# Patient Record
Sex: Female | Born: 1990 | Race: White | Hispanic: No | Marital: Single | Smoking: Current every day smoker
Health system: Southern US, Community
[De-identification: ages and names within clinical notes are randomized; demographics above are authoritative.]

## PROBLEM LIST (undated history)

## (undated) DIAGNOSIS — J45909 Unspecified asthma, uncomplicated: Secondary | ICD-10-CM

## (undated) DIAGNOSIS — F419 Anxiety disorder, unspecified: Secondary | ICD-10-CM

## (undated) DIAGNOSIS — J939 Pneumothorax, unspecified: Secondary | ICD-10-CM

## (undated) HISTORY — PX: TONSILLECTOMY: SUR1361

---

## 2014-05-09 ENCOUNTER — Inpatient Hospital Stay
Admit: 2014-05-09 | Discharge: 2014-05-09 | Disposition: A | Discharge status: Home / Self Care | Payer: Self-pay | Attending: Emergency Medicine

## 2014-05-09 ENCOUNTER — Emergency Department: Admit: 2014-05-09 | Payer: Self-pay

## 2014-05-09 DIAGNOSIS — J45901 Unspecified asthma with (acute) exacerbation: Secondary | ICD-10-CM

## 2014-05-09 DIAGNOSIS — J209 Acute bronchitis, unspecified: Secondary | ICD-10-CM

## 2014-05-09 MED ORDER — ALBUTEROL SULFATE HFA 90 MCG/ACTUATION AEROSOL INHALER
90 mcg/actuation | RESPIRATORY_TRACT | Status: AC | PRN
Start: 2014-05-09 — End: ?

## 2014-05-09 MED ORDER — IPRATROPIUM-ALBUTEROL 2.5 MG-0.5 MG/3 ML NEB SOLUTION
2.5 mg-0.5 mg/3 ml | RESPIRATORY_TRACT | Status: AC
Start: 2014-05-09 — End: 2014-05-09
  Administered 2014-05-09: 08:00:00 via RESPIRATORY_TRACT

## 2014-05-09 MED ORDER — AZITHROMYCIN 250 MG TAB
250 mg | ORAL | Status: AC
Start: 2014-05-09 — End: 2014-05-09
  Administered 2014-05-09: 18:00:00 via ORAL

## 2014-05-09 MED ORDER — IPRATROPIUM-ALBUTEROL 2.5 MG-0.5 MG/3 ML NEB SOLUTION
2.5 mg-0.5 mg/3 ml | RESPIRATORY_TRACT | Status: DC
Start: 2014-05-09 — End: 2014-05-09

## 2014-05-09 MED ORDER — AZITHROMYCIN 250 MG TAB
250 mg | ORAL | Status: AC
Start: 2014-05-09 — End: ?

## 2014-05-09 MED ORDER — PREDNISONE 20 MG TAB
20 mg | ORAL_TABLET | Freq: Every day | ORAL | Status: AC
Start: 2014-05-09 — End: 2014-05-14

## 2014-05-09 MED ORDER — IPRATROPIUM-ALBUTEROL 2.5 MG-0.5 MG/3 ML NEB SOLUTION
2.5 mg-0.5 mg/3 ml | RESPIRATORY_TRACT | Status: AC
Start: 2014-05-09 — End: 2014-05-09
  Administered 2014-05-09: 16:00:00 via RESPIRATORY_TRACT

## 2014-05-09 MED ORDER — METHYLPREDNISOLONE (PF) 125 MG/2 ML IJ SOLR
125 mg/2 mL | INTRAMUSCULAR | Status: AC
Start: 2014-05-09 — End: 2014-05-09
  Administered 2014-05-09: 16:00:00 via INTRAMUSCULAR

## 2014-05-09 MED FILL — IPRATROPIUM-ALBUTEROL 2.5 MG-0.5 MG/3 ML NEB SOLUTION: 2.5 mg-0.5 mg/3 ml | RESPIRATORY_TRACT | Qty: 3

## 2014-05-09 MED FILL — SOLU-MEDROL (PF) 125 MG/2 ML SOLUTION FOR INJECTION: 125 mg/2 mL | INTRAMUSCULAR | Qty: 2

## 2014-05-09 MED FILL — AZITHROMYCIN 250 MG TAB: 250 mg | ORAL | Qty: 2

## 2014-05-09 NOTE — ED Notes (Signed)
Patient here this morning for same complaint attempt prednisone and inhaler pta no relief.

## 2014-05-09 NOTE — ED Notes (Signed)
Await xray

## 2014-05-09 NOTE — ED Notes (Signed)
Patient states wheezing since she last took her prednisone at around 1900, patient with audible wheezes. sats 94-95%

## 2014-05-09 NOTE — ED Notes (Signed)
I have reviewed discharge instructions with the patient.  The patient verbalized understanding.

## 2014-05-09 NOTE — ED Notes (Signed)
Rt at bedside for treatment

## 2014-05-09 NOTE — ED Provider Notes (Signed)
Patient is a 23 y.o. female presenting with wheezing. The history is provided by the patient.   Wheezing   This is a recurrent problem. The current episode started 1 to 2 hours ago. The problem occurs constantly. The problem has not changed since onset.Pertinent negatives include no chest pain and no fever. It is unknown what precipitated the problem. She has tried ipratropium inhalers and oral steroids for the symptoms. The treatment provided no relief. She has had no prior hospitalizations. She has had no prior ICU admissions. Her past medical history is significant for asthma.        Past Medical History   Diagnosis Date   ??? Asthma         Past Surgical History   Procedure Laterality Date   ??? Hx tonsillectomy           History reviewed. No pertinent family history.     History     Social History   ??? Marital Status: SINGLE     Spouse Name: N/A     Number of Children: N/A   ??? Years of Education: N/A     Occupational History   ??? Not on file.     Social History Main Topics   ??? Smoking status: Current Some Day Smoker   ??? Smokeless tobacco: Not on file   ??? Alcohol Use: Yes   ??? Drug Use: No   ??? Sexual Activity: Not on file     Other Topics Concern   ??? Not on file     Social History Narrative                  ALLERGIES: Review of patient's allergies indicates no known allergies.      Review of Systems   Constitutional: Negative for fever.   Respiratory: Positive for wheezing.    Cardiovascular: Negative for chest pain.   All other systems reviewed and are negative.      Filed Vitals:    05/09/14 1128 05/09/14 1142   BP: 118/50    Pulse: 116    Temp: 97.6 ??F (36.4 ??C)    Resp: 18    Height: 5\' 1"  (1.549 m)    Weight: 54.885 kg (121 lb)    SpO2: 95% 95%            Physical Exam   Constitutional: She is oriented to person, place, and time. She appears well-developed and well-nourished. No distress.   HENT:   Head: Normocephalic and atraumatic.   Marked nasal congestion     Eyes: Conjunctivae and EOM are normal. Pupils are equal, round, and reactive to light.   Neck: Normal range of motion. Neck supple.   Cardiovascular: Normal rate and regular rhythm.    Pulmonary/Chest: Effort normal. No respiratory distress. She has wheezes.   Wheezes more to rt than left lungs fields    Abdominal: Soft. Bowel sounds are normal.   Musculoskeletal: Normal range of motion. She exhibits no edema.   Neurological: She is alert and oriented to person, place, and time.   Skin: Skin is warm.   Nursing note and vitals reviewed.       MDM  Number of Diagnoses or Management Options  Diagnosis management comments: Asthmatic bronchitis  Pt much better post duo neb x 2 and solumedrol  Chest x ray w/o infiltrate, some emphysema but no crepitus, no difficulty swallowing  Pt discussed with Dr. Marcene CorningBourdon, stressed to start z pac today also, continue at home meds  along with over the counter cold meds       Amount and/or Complexity of Data Reviewed  Tests in the radiology section of CPT??: ordered and reviewed  Review and summarize past medical records: yes  Discuss the patient with other providers: yes    Risk of Complications, Morbidity, and/or Mortality  Presenting problems: moderate  Diagnostic procedures: moderate  Management options: low    Patient Progress  Patient progress: improved      Procedures

## 2014-05-09 NOTE — ED Provider Notes (Signed)
HPI Comments: 23 year old white female with history of asthma presents with cough and wheezing for the past day.  She did take 30 mg of prednisone she had from a previous episode and has been using albuterol with minimal improvement.  She has had subjective fever but no measured temperature.  No vomiting.  no leg pain or edema    Patient is a 23 y.o. female presenting with wheezing. The history is provided by the patient.   Wheezing   Associated symptoms include cough. Pertinent negatives include no fever, no abdominal pain, no vomiting, no headaches and no rash.        Past Medical History   Diagnosis Date   ??? Asthma         Past Surgical History   Procedure Laterality Date   ??? Hx tonsillectomy           History reviewed. No pertinent family history.     History     Social History   ??? Marital Status: SINGLE     Spouse Name: N/A     Number of Children: N/A   ??? Years of Education: N/A     Occupational History   ??? Not on file.     Social History Main Topics   ??? Smoking status: Current Some Day Smoker   ??? Smokeless tobacco: Not on file   ??? Alcohol Use: Yes   ??? Drug Use: No   ??? Sexual Activity: Not on file     Other Topics Concern   ??? Not on file     Social History Narrative   ??? No narrative on file                  ALLERGIES: Review of patient's allergies indicates no known allergies.      Review of Systems   Constitutional: Negative for fever.   Respiratory: Positive for cough and wheezing.    Gastrointestinal: Negative for nausea, vomiting and abdominal pain.   Musculoskeletal: Negative for back pain.   Skin: Negative for rash.   Neurological: Negative for headaches.       Filed Vitals:    05/09/14 0345 05/09/14 0422   BP: 121/90    Pulse: 103    Temp: 97.6 ??F (36.4 ??C)    Resp: 20    Height: 5\' 1"  (1.549 m)    Weight: 54.432 kg (120 lb)    SpO2: 96% 94%            Physical Exam   Constitutional: She appears well-developed and well-nourished. No distress.   HENT:   Head: Normocephalic and atraumatic.    Eyes: Pupils are equal, round, and reactive to light.   Neck: Normal range of motion. Neck supple.   Cardiovascular: Normal rate and regular rhythm.    Pulmonary/Chest: Effort normal. She has wheezes.   Abdominal: Soft. There is no tenderness.   Musculoskeletal: Normal range of motion. She exhibits no edema.   Neurological: She is alert.   Skin: Skin is warm and dry.   Psychiatric: She has a normal mood and affect.   Nursing note and vitals reviewed.       MDM  Number of Diagnoses or Management Options  Diagnosis management comments: Symptoms improved with a DuoNeb.  At this time patient is nontoxic and appears safe for outpatient management.  Treated with Zithromax, prednisone and albuterol      Procedures

## 2014-05-09 NOTE — ED Notes (Signed)
Patient transported to Xray.

## 2014-05-09 NOTE — ED Notes (Signed)
I have reviewed discharge instructions with the patient.  The patient verbalized understanding. Patient is ambulatory from facility in no acute distress. Patient has discharge instructions in hand at time of departure.

## 2015-07-20 ENCOUNTER — Inpatient Hospital Stay (HOSPITAL_COMMUNITY)
Admission: EM | Admit: 2015-07-20 | Discharge: 2015-07-25 | DRG: 166 | Disposition: A | Discharge status: Home / Self Care | Payer: BLUE CROSS/BLUE SHIELD | Attending: Orthopedic Surgery | Admitting: Orthopedic Surgery

## 2015-07-20 DIAGNOSIS — Y92411 Interstate highway as the place of occurrence of the external cause: Secondary | ICD-10-CM

## 2015-07-20 DIAGNOSIS — S060XAA Concussion with loss of consciousness status unknown, initial encounter: Secondary | ICD-10-CM | POA: Diagnosis present

## 2015-07-20 DIAGNOSIS — Z880 Allergy status to penicillin: Secondary | ICD-10-CM

## 2015-07-20 DIAGNOSIS — S52501A Unspecified fracture of the lower end of right radius, initial encounter for closed fracture: Secondary | ICD-10-CM | POA: Diagnosis present

## 2015-07-20 DIAGNOSIS — S2220XA Unspecified fracture of sternum, initial encounter for closed fracture: Secondary | ICD-10-CM | POA: Diagnosis present

## 2015-07-20 DIAGNOSIS — S2242XA Multiple fractures of ribs, left side, initial encounter for closed fracture: Secondary | ICD-10-CM | POA: Diagnosis present

## 2015-07-20 DIAGNOSIS — D62 Acute posthemorrhagic anemia: Secondary | ICD-10-CM | POA: Diagnosis not present

## 2015-07-20 DIAGNOSIS — S32049A Unspecified fracture of fourth lumbar vertebra, initial encounter for closed fracture: Secondary | ICD-10-CM | POA: Diagnosis present

## 2015-07-20 DIAGNOSIS — J189 Pneumonia, unspecified organism: Secondary | ICD-10-CM

## 2015-07-20 DIAGNOSIS — F1290 Cannabis use, unspecified, uncomplicated: Secondary | ICD-10-CM | POA: Diagnosis present

## 2015-07-20 DIAGNOSIS — Z9689 Presence of other specified functional implants: Secondary | ICD-10-CM

## 2015-07-20 DIAGNOSIS — S2222XA Fracture of body of sternum, initial encounter for closed fracture: Secondary | ICD-10-CM | POA: Diagnosis present

## 2015-07-20 DIAGNOSIS — J939 Pneumothorax, unspecified: Secondary | ICD-10-CM

## 2015-07-20 DIAGNOSIS — S272XXA Traumatic hemopneumothorax, initial encounter: Secondary | ICD-10-CM | POA: Diagnosis not present

## 2015-07-20 DIAGNOSIS — S61312A Laceration without foreign body of right middle finger with damage to nail, initial encounter: Secondary | ICD-10-CM

## 2015-07-20 DIAGNOSIS — F419 Anxiety disorder, unspecified: Secondary | ICD-10-CM | POA: Diagnosis present

## 2015-07-20 DIAGNOSIS — S27321A Contusion of lung, unilateral, initial encounter: Secondary | ICD-10-CM | POA: Diagnosis present

## 2015-07-20 DIAGNOSIS — S68622A Partial traumatic transphalangeal amputation of right middle finger, initial encounter: Secondary | ICD-10-CM | POA: Diagnosis present

## 2015-07-20 DIAGNOSIS — S62101A Fracture of unspecified carpal bone, right wrist, initial encounter for closed fracture: Secondary | ICD-10-CM | POA: Diagnosis present

## 2015-07-20 DIAGNOSIS — S27329A Contusion of lung, unspecified, initial encounter: Secondary | ICD-10-CM

## 2015-07-20 DIAGNOSIS — S2232XA Fracture of one rib, left side, initial encounter for closed fracture: Secondary | ICD-10-CM

## 2015-07-20 DIAGNOSIS — S52611A Displaced fracture of right ulna styloid process, initial encounter for closed fracture: Secondary | ICD-10-CM | POA: Diagnosis present

## 2015-07-20 DIAGNOSIS — J45909 Unspecified asthma, uncomplicated: Secondary | ICD-10-CM | POA: Diagnosis present

## 2015-07-20 DIAGNOSIS — B9562 Methicillin resistant Staphylococcus aureus infection as the cause of diseases classified elsewhere: Secondary | ICD-10-CM | POA: Diagnosis present

## 2015-07-20 DIAGNOSIS — F17200 Nicotine dependence, unspecified, uncomplicated: Secondary | ICD-10-CM | POA: Diagnosis present

## 2015-07-20 DIAGNOSIS — S51011A Laceration without foreign body of right elbow, initial encounter: Secondary | ICD-10-CM | POA: Diagnosis present

## 2015-07-20 DIAGNOSIS — S060X9A Concussion with loss of consciousness of unspecified duration, initial encounter: Secondary | ICD-10-CM | POA: Diagnosis present

## 2015-07-20 HISTORY — DX: Pneumothorax, unspecified: J93.9

## 2015-07-20 HISTORY — DX: Unspecified asthma, uncomplicated: J45.909

## 2015-07-20 HISTORY — DX: Anxiety disorder, unspecified: F41.9
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
## 2015-07-21 ENCOUNTER — Encounter (HOSPITAL_COMMUNITY): Admission: EM | Disposition: A | Discharge status: Home / Self Care | Payer: Self-pay | Source: Home / Self Care

## 2015-07-21 ENCOUNTER — Emergency Department (HOSPITAL_COMMUNITY): Payer: BLUE CROSS/BLUE SHIELD

## 2015-07-21 ENCOUNTER — Inpatient Hospital Stay (HOSPITAL_COMMUNITY): Payer: BLUE CROSS/BLUE SHIELD | Admitting: Certified Registered Nurse Anesthetist

## 2015-07-21 ENCOUNTER — Encounter (HOSPITAL_COMMUNITY): Payer: Self-pay | Admitting: *Deleted

## 2015-07-21 DIAGNOSIS — D62 Acute posthemorrhagic anemia: Secondary | ICD-10-CM | POA: Diagnosis not present

## 2015-07-21 DIAGNOSIS — S2242XA Multiple fractures of ribs, left side, initial encounter for closed fracture: Secondary | ICD-10-CM | POA: Diagnosis present

## 2015-07-21 DIAGNOSIS — S51011A Laceration without foreign body of right elbow, initial encounter: Secondary | ICD-10-CM | POA: Diagnosis present

## 2015-07-21 DIAGNOSIS — B9562 Methicillin resistant Staphylococcus aureus infection as the cause of diseases classified elsewhere: Secondary | ICD-10-CM | POA: Diagnosis present

## 2015-07-21 DIAGNOSIS — S68622A Partial traumatic transphalangeal amputation of right middle finger, initial encounter: Secondary | ICD-10-CM | POA: Diagnosis present

## 2015-07-21 DIAGNOSIS — S52611A Displaced fracture of right ulna styloid process, initial encounter for closed fracture: Secondary | ICD-10-CM | POA: Diagnosis present

## 2015-07-21 DIAGNOSIS — S52501A Unspecified fracture of the lower end of right radius, initial encounter for closed fracture: Secondary | ICD-10-CM | POA: Diagnosis present

## 2015-07-21 DIAGNOSIS — J45909 Unspecified asthma, uncomplicated: Secondary | ICD-10-CM | POA: Diagnosis present

## 2015-07-21 DIAGNOSIS — S272XXA Traumatic hemopneumothorax, initial encounter: Secondary | ICD-10-CM | POA: Diagnosis present

## 2015-07-21 DIAGNOSIS — F419 Anxiety disorder, unspecified: Secondary | ICD-10-CM | POA: Diagnosis present

## 2015-07-21 DIAGNOSIS — J939 Pneumothorax, unspecified: Secondary | ICD-10-CM | POA: Diagnosis present

## 2015-07-21 DIAGNOSIS — F17200 Nicotine dependence, unspecified, uncomplicated: Secondary | ICD-10-CM | POA: Diagnosis present

## 2015-07-21 DIAGNOSIS — Y92411 Interstate highway as the place of occurrence of the external cause: Secondary | ICD-10-CM | POA: Diagnosis not present

## 2015-07-21 DIAGNOSIS — S27321A Contusion of lung, unilateral, initial encounter: Secondary | ICD-10-CM | POA: Diagnosis present

## 2015-07-21 DIAGNOSIS — S2222XA Fracture of body of sternum, initial encounter for closed fracture: Secondary | ICD-10-CM | POA: Diagnosis present

## 2015-07-21 DIAGNOSIS — Z880 Allergy status to penicillin: Secondary | ICD-10-CM | POA: Diagnosis not present

## 2015-07-21 DIAGNOSIS — F1290 Cannabis use, unspecified, uncomplicated: Secondary | ICD-10-CM | POA: Diagnosis present

## 2015-07-21 DIAGNOSIS — S32049A Unspecified fracture of fourth lumbar vertebra, initial encounter for closed fracture: Secondary | ICD-10-CM | POA: Diagnosis present

## 2015-07-21 DIAGNOSIS — J189 Pneumonia, unspecified organism: Secondary | ICD-10-CM | POA: Diagnosis not present

## 2015-07-21 DIAGNOSIS — S060X9A Concussion with loss of consciousness of unspecified duration, initial encounter: Secondary | ICD-10-CM | POA: Diagnosis present

## 2015-07-21 HISTORY — DX: Pneumothorax, unspecified: J93.9

## 2015-07-21 HISTORY — PX: I & D EXTREMITY: SHX5045

## 2015-07-21 HISTORY — PX: OTHER SURGICAL HISTORY: SHX169

## 2015-07-21 HISTORY — PX: ORIF RADIAL FRACTURE: SHX5113

## 2015-07-21 LAB — CBC WITH DIFFERENTIAL/PLATELET
BASOS PCT: 0 %
Basophils Absolute: 0 10*3/uL (ref 0.0–0.1)
EOS ABS: 0.2 10*3/uL (ref 0.0–0.7)
Eosinophils Relative: 1 %
HEMATOCRIT: 49 % — AB (ref 36.0–46.0)
HEMOGLOBIN: 15.8 g/dL — AB (ref 12.0–15.0)
LYMPHS ABS: 1 10*3/uL (ref 0.7–4.0)
Lymphocytes Relative: 5 %
MCH: 30.3 pg (ref 26.0–34.0)
MCHC: 32.2 g/dL (ref 30.0–36.0)
MCV: 94 fL (ref 78.0–100.0)
Monocytes Absolute: 1 10*3/uL (ref 0.1–1.0)
Monocytes Relative: 5 %
NEUTROS ABS: 16.6 10*3/uL — AB (ref 1.7–7.7)
NEUTROS PCT: 89 %
Platelets: 348 10*3/uL (ref 150–400)
RBC: 5.21 MIL/uL — AB (ref 3.87–5.11)
RDW: 12.6 % (ref 11.5–15.5)
WBC: 18.8 10*3/uL — AB (ref 4.0–10.5)

## 2015-07-21 LAB — I-STAT BETA HCG BLOOD, ED (MC, WL, AP ONLY)

## 2015-07-21 LAB — URINALYSIS, ROUTINE W REFLEX MICROSCOPIC
BILIRUBIN URINE: NEGATIVE
GLUCOSE, UA: NEGATIVE mg/dL
Hgb urine dipstick: NEGATIVE
KETONES UR: 40 mg/dL — AB
Leukocytes, UA: NEGATIVE
Nitrite: NEGATIVE
PH: 7 (ref 5.0–8.0)
Protein, ur: NEGATIVE mg/dL
Specific Gravity, Urine: 1.012 (ref 1.005–1.030)

## 2015-07-21 LAB — BASIC METABOLIC PANEL
Anion gap: 13 (ref 5–15)
BUN: 5 mg/dL — ABNORMAL LOW (ref 6–20)
CHLORIDE: 104 mmol/L (ref 101–111)
CO2: 22 mmol/L (ref 22–32)
Calcium: 10.1 mg/dL (ref 8.9–10.3)
Creatinine, Ser: 0.81 mg/dL (ref 0.44–1.00)
GFR calc non Af Amer: >60 mL/min (ref >60)
Glucose, Bld: 113 mg/dL — ABNORMAL HIGH (ref 65–99)
POTASSIUM: 4.6 mmol/L (ref 3.5–5.1)
SODIUM: 139 mmol/L (ref 135–145)

## 2015-07-21 LAB — SURGICAL PCR SCREEN
MRSA, PCR: POSITIVE — AB
Staphylococcus aureus: POSITIVE — AB

## 2015-07-21 LAB — ETHANOL

## 2015-07-21 SURGERY — OPEN REDUCTION INTERNAL FIXATION (ORIF) RADIAL FRACTURE
Anesthesia: General | Site: Wrist | Laterality: Right

## 2015-07-21 SURGERY — OPEN REDUCTION INTERNAL FIXATION (ORIF) RADIAL FRACTURE
Anesthesia: General | Laterality: Right

## 2015-07-21 MED ORDER — PROMETHAZINE HCL 25 MG/ML IJ SOLN: 6.2500 mg | INTRAMUSCULAR | Status: DC | PRN

## 2015-07-21 MED ORDER — HYDROMORPHONE HCL 1 MG/ML IJ SOLN
1.0000 mg | Freq: Once | INTRAMUSCULAR | Status: AC
  Administered 2015-07-21: 1 mg via INTRAVENOUS
  Filled 2015-07-21: qty 1

## 2015-07-21 MED ORDER — MIDAZOLAM HCL 2 MG/2ML IJ SOLN
4.0000 mg | Freq: Once | INTRAMUSCULAR | Status: AC
  Administered 2015-07-21: 4 mg via INTRAVENOUS

## 2015-07-21 MED ORDER — MEPIVACAINE HCL 1.5 % IJ SOLN
INTRAMUSCULAR | Status: DC | PRN
  Administered 2015-07-21: 10 mL via PERINEURAL

## 2015-07-21 MED ORDER — HYDROMORPHONE HCL 1 MG/ML IJ SOLN
1.0000 mg | Freq: Once | INTRAMUSCULAR | Status: AC
  Administered 2015-07-21: 1 mg via INTRAVENOUS

## 2015-07-21 MED ORDER — ONDANSETRON HCL 4 MG PO TABS: 4.0000 mg | ORAL_TABLET | Freq: Four times a day (QID) | ORAL | Status: DC | PRN

## 2015-07-21 MED ORDER — MIDAZOLAM HCL 2 MG/2ML IJ SOLN
INTRAMUSCULAR | Status: AC
  Filled 2015-07-21: qty 2

## 2015-07-21 MED ORDER — MUPIROCIN 2 % EX OINT
TOPICAL_OINTMENT | Freq: Two times a day (BID) | CUTANEOUS | Status: DC
  Administered 2015-07-21 – 2015-07-25 (×8): via NASAL

## 2015-07-21 MED ORDER — FENTANYL CITRATE (PF) 100 MCG/2ML IJ SOLN
INTRAMUSCULAR | Status: DC | PRN
  Administered 2015-07-21: 50 ug via INTRAVENOUS
  Administered 2015-07-21 (×2): 100 ug via INTRAVENOUS
  Administered 2015-07-21 (×2): 50 ug via INTRAVENOUS

## 2015-07-21 MED ORDER — POLYETHYLENE GLYCOL 3350 17 G PO PACK
17.0000 g | PACK | Freq: Every day | ORAL | Status: DC
  Filled 2015-07-21 (×3): qty 1

## 2015-07-21 MED ORDER — ONDANSETRON HCL 4 MG/2ML IJ SOLN: 4.0000 mg | Freq: Four times a day (QID) | INTRAMUSCULAR | Status: DC | PRN

## 2015-07-21 MED ORDER — ONDANSETRON HCL 4 MG/2ML IJ SOLN
INTRAMUSCULAR | Status: AC
  Filled 2015-07-21: qty 2

## 2015-07-21 MED ORDER — PHENYLEPHRINE HCL 10 MG/ML IJ SOLN
INTRAMUSCULAR | Status: DC | PRN
  Administered 2015-07-21: 80 ug via INTRAVENOUS
  Administered 2015-07-21: 40 ug via INTRAVENOUS
  Administered 2015-07-21 (×2): 80 ug via INTRAVENOUS
  Administered 2015-07-21: 40 ug via INTRAVENOUS

## 2015-07-21 MED ORDER — ENOXAPARIN SODIUM 30 MG/0.3ML ~~LOC~~ SOLN
30.0000 mg | SUBCUTANEOUS | Status: DC
  Administered 2015-07-22 – 2015-07-25 (×4): 30 mg via SUBCUTANEOUS
  Filled 2015-07-21 (×4): qty 0.3

## 2015-07-21 MED ORDER — FENTANYL CITRATE (PF) 100 MCG/2ML IJ SOLN
200.0000 ug | Freq: Once | INTRAMUSCULAR | Status: AC
  Administered 2015-07-21: 150 ug via INTRAVENOUS
  Filled 2015-07-21: qty 4

## 2015-07-21 MED ORDER — PROPOFOL 10 MG/ML IV BOLUS
INTRAVENOUS | Status: AC
  Filled 2015-07-21: qty 40

## 2015-07-21 MED ORDER — CEFAZOLIN SODIUM 1-5 GM-% IV SOLN
1.0000 g | Freq: Three times a day (TID) | INTRAVENOUS | Status: DC
  Administered 2015-07-21 – 2015-07-22 (×2): 1 g via INTRAVENOUS
  Filled 2015-07-21 (×6): qty 50

## 2015-07-21 MED ORDER — DOCUSATE SODIUM 100 MG PO CAPS
100.0000 mg | ORAL_CAPSULE | Freq: Two times a day (BID) | ORAL | Status: DC
  Administered 2015-07-22 (×2): 100 mg via ORAL
  Filled 2015-07-21 (×6): qty 1

## 2015-07-21 MED ORDER — METHOCARBAMOL 500 MG PO TABS
500.0000 mg | ORAL_TABLET | Freq: Four times a day (QID) | ORAL | Status: DC | PRN
  Administered 2015-07-22 – 2015-07-25 (×12): 500 mg via ORAL
  Filled 2015-07-21 (×12): qty 1

## 2015-07-21 MED ORDER — BUPIVACAINE-EPINEPHRINE (PF) 0.5% -1:200000 IJ SOLN
INTRAMUSCULAR | Status: DC | PRN
  Administered 2015-07-21: 20 mL via PERINEURAL

## 2015-07-21 MED ORDER — LIDOCAINE-EPINEPHRINE 1 %-1:100000 IJ SOLN
10.0000 mL | Freq: Once | INTRAMUSCULAR | Status: AC
  Administered 2015-07-21: 10 mL via INTRADERMAL
  Filled 2015-07-21: qty 1

## 2015-07-21 MED ORDER — LIDOCAINE HCL (CARDIAC) 20 MG/ML IV SOLN
INTRAVENOUS | Status: DC | PRN
  Administered 2015-07-21: 50 mg via INTRAVENOUS

## 2015-07-21 MED ORDER — MOMETASONE FURO-FORMOTEROL FUM 100-5 MCG/ACT IN AERO
2.0000 | INHALATION_SPRAY | Freq: Two times a day (BID) | RESPIRATORY_TRACT | Status: DC
  Administered 2015-07-21 – 2015-07-25 (×8): 2 via RESPIRATORY_TRACT
  Filled 2015-07-21: qty 8.8

## 2015-07-21 MED ORDER — OXYCODONE HCL 5 MG PO TABS: 5.0000 mg | ORAL_TABLET | ORAL | Status: DC | PRN

## 2015-07-21 MED ORDER — MIDAZOLAM HCL 2 MG/2ML IJ SOLN
INTRAMUSCULAR | Status: AC
  Filled 2015-07-21: qty 4

## 2015-07-21 MED ORDER — TETANUS-DIPHTH-ACELL PERTUSSIS 5-2.5-18.5 LF-MCG/0.5 IM SUSP
0.5000 mL | Freq: Once | INTRAMUSCULAR | Status: AC
  Administered 2015-07-21: 0.5 mL via INTRAMUSCULAR
  Filled 2015-07-21: qty 0.5

## 2015-07-21 MED ORDER — MIDAZOLAM HCL 5 MG/5ML IJ SOLN
INTRAMUSCULAR | Status: DC | PRN
  Administered 2015-07-21: 1 mg via INTRAVENOUS
  Administered 2015-07-21: 2 mg via INTRAVENOUS
  Administered 2015-07-21: 1 mg via INTRAVENOUS

## 2015-07-21 MED ORDER — IOHEXOL 300 MG/ML  SOLN
100.0000 mL | Freq: Once | INTRAMUSCULAR | Status: AC | PRN
  Administered 2015-07-21: 100 mL via INTRAVENOUS

## 2015-07-21 MED ORDER — LACTATED RINGERS IV SOLN
INTRAVENOUS | Status: DC | PRN
  Administered 2015-07-21 (×3): via INTRAVENOUS

## 2015-07-21 MED ORDER — ALBUTEROL SULFATE (2.5 MG/3ML) 0.083% IN NEBU
2.5000 mg | INHALATION_SOLUTION | Freq: Four times a day (QID) | RESPIRATORY_TRACT | Status: DC | PRN
  Administered 2015-07-21 – 2015-07-24 (×5): 2.5 mg via RESPIRATORY_TRACT
  Filled 2015-07-21 (×5): qty 3

## 2015-07-21 MED ORDER — SODIUM CHLORIDE 0.9 % IV BOLUS (SEPSIS)
1000.0000 mL | Freq: Once | INTRAVENOUS | Status: AC
  Administered 2015-07-21: 1000 mL via INTRAVENOUS

## 2015-07-21 MED ORDER — PROMETHAZINE HCL 25 MG RE SUPP: 12.5000 mg | Freq: Four times a day (QID) | RECTAL | Status: DC | PRN

## 2015-07-21 MED ORDER — CEFAZOLIN SODIUM-DEXTROSE 2-3 GM-% IV SOLR
2.0000 g | Freq: Once | INTRAVENOUS | Status: AC
  Administered 2015-07-21: 2 g via INTRAVENOUS
  Filled 2015-07-21: qty 50

## 2015-07-21 MED ORDER — VANCOMYCIN HCL IN DEXTROSE 750-5 MG/150ML-% IV SOLN
750.0000 mg | Freq: Two times a day (BID) | INTRAVENOUS | Status: DC
  Administered 2015-07-22 – 2015-07-25 (×7): 750 mg via INTRAVENOUS
  Filled 2015-07-21 (×8): qty 150

## 2015-07-21 MED ORDER — FENTANYL CITRATE (PF) 100 MCG/2ML IJ SOLN: 25.0000 ug | INTRAMUSCULAR | Status: DC | PRN

## 2015-07-21 MED ORDER — LIDOCAINE HCL (PF) 1 % IJ SOLN
INTRAMUSCULAR | Status: AC
  Administered 2015-07-21: 10 mL
  Filled 2015-07-21: qty 10

## 2015-07-21 MED ORDER — HYDROCODONE-ACETAMINOPHEN 10-325 MG PO TABS
0.5000 | ORAL_TABLET | ORAL | Status: DC | PRN
  Administered 2015-07-21 – 2015-07-23 (×7): 2 via ORAL
  Filled 2015-07-21 (×8): qty 2

## 2015-07-21 MED ORDER — HYDROMORPHONE HCL 1 MG/ML IJ SOLN
INTRAMUSCULAR | Status: AC
  Administered 2015-07-21: 1 mg via INTRAVENOUS
  Filled 2015-07-21: qty 1

## 2015-07-21 MED ORDER — HYDROMORPHONE HCL 1 MG/ML IJ SOLN
INTRAMUSCULAR | Status: AC
  Filled 2015-07-21: qty 1

## 2015-07-21 MED ORDER — HYDROMORPHONE HCL 1 MG/ML IJ SOLN
0.5000 mg | INTRAMUSCULAR | Status: DC | PRN
  Administered 2015-07-21 – 2015-07-23 (×9): 1 mg via INTRAVENOUS
  Filled 2015-07-21 (×9): qty 1

## 2015-07-21 MED ORDER — PROPOFOL 10 MG/ML IV BOLUS
INTRAVENOUS | Status: DC | PRN
  Administered 2015-07-21: 120 mg via INTRAVENOUS

## 2015-07-21 MED ORDER — METHOCARBAMOL 1000 MG/10ML IJ SOLN: 500.0000 mg | Freq: Four times a day (QID) | INTRAMUSCULAR | Status: DC | PRN

## 2015-07-21 MED ORDER — VITAMIN C 500 MG PO TABS
1000.0000 mg | ORAL_TABLET | Freq: Every day | ORAL | Status: DC
  Administered 2015-07-22 – 2015-07-25 (×4): 1000 mg via ORAL
  Filled 2015-07-21 (×4): qty 2

## 2015-07-21 MED ORDER — LIDOCAINE HCL (PF) 1 % IJ SOLN
INTRAMUSCULAR | Status: AC
  Filled 2015-07-21: qty 10

## 2015-07-21 MED ORDER — POTASSIUM CHLORIDE IN NACL 20-0.45 MEQ/L-% IV SOLN
INTRAVENOUS | Status: DC
  Administered 2015-07-21 – 2015-07-22 (×2): via INTRAVENOUS
  Filled 2015-07-21 (×3): qty 1000

## 2015-07-21 MED ORDER — VANCOMYCIN HCL 1000 MG IV SOLR
1000.0000 mg | INTRAVENOUS | Status: DC | PRN
  Administered 2015-07-21: 1000 mg via INTRAVENOUS

## 2015-07-21 MED ORDER — LIDOCAINE HCL (CARDIAC) 20 MG/ML IV SOLN
INTRAVENOUS | Status: AC
  Filled 2015-07-21: qty 5

## 2015-07-21 MED ORDER — LORAZEPAM 1 MG PO TABS
1.0000 mg | ORAL_TABLET | Freq: Three times a day (TID) | ORAL | Status: DC | PRN
  Administered 2015-07-21 – 2015-07-25 (×12): 1 mg via ORAL
  Filled 2015-07-21 (×12): qty 1

## 2015-07-21 MED ORDER — HYDROMORPHONE HCL 1 MG/ML IJ SOLN
0.5000 mg | INTRAMUSCULAR | Status: DC | PRN
  Administered 2015-07-21 (×7): 0.5 mg via INTRAVENOUS
  Filled 2015-07-21 (×3): qty 1

## 2015-07-21 MED ORDER — FENTANYL CITRATE (PF) 250 MCG/5ML IJ SOLN
INTRAMUSCULAR | Status: AC
  Filled 2015-07-21: qty 5

## 2015-07-21 MED ORDER — ONDANSETRON HCL 4 MG/2ML IJ SOLN
4.0000 mg | Freq: Four times a day (QID) | INTRAMUSCULAR | Status: DC | PRN
  Administered 2015-07-21: 4 mg via INTRAVENOUS

## 2015-07-21 MED ORDER — DOCUSATE SODIUM 100 MG PO CAPS: 100.0000 mg | ORAL_CAPSULE | Freq: Two times a day (BID) | ORAL | Status: DC

## 2015-07-21 MED ORDER — 0.9 % SODIUM CHLORIDE (POUR BTL) OPTIME
TOPICAL | Status: DC | PRN
  Administered 2015-07-21: 1000 mL

## 2015-07-21 MED ORDER — HYDROMORPHONE HCL 1 MG/ML IJ SOLN
1.0000 mg | Freq: Once | INTRAMUSCULAR | Status: AC
Start: 2015-07-21 — End: 2015-07-21
  Administered 2015-07-21: 1 mg via INTRAVENOUS
  Filled 2015-07-21: qty 1

## 2015-07-21 MED ORDER — LACTATED RINGERS IV SOLN
INTRAVENOUS | Status: DC
  Administered 2015-07-21: 16:00:00 via INTRAVENOUS

## 2015-07-21 MED ORDER — MUPIROCIN 2 % EX OINT
TOPICAL_OINTMENT | CUTANEOUS | Status: AC
  Filled 2015-07-21: qty 22

## 2015-07-21 MED ORDER — HYDROMORPHONE HCL 1 MG/ML IJ SOLN
0.2500 mg | INTRAMUSCULAR | Status: DC | PRN
  Administered 2015-07-21 (×2): 0.25 mg via INTRAVENOUS

## 2015-07-21 SURGICAL SUPPLY — 72 items
BAG ZIPLOCK 12X15 (MISCELLANEOUS) ×3 IMPLANT
BANDAGE ACE 3X5.8 VEL STRL LF (GAUZE/BANDAGES/DRESSINGS) ×3 IMPLANT
BANDAGE ACE 4X5 VEL STRL LF (GAUZE/BANDAGES/DRESSINGS) ×3 IMPLANT
BIT DRILL 2.2 SS TIBIAL (BIT) ×3 IMPLANT
BNDG COHESIVE 4X5 TAN STRL (GAUZE/BANDAGES/DRESSINGS) ×3 IMPLANT
BNDG GAUZE ELAST 4 BULKY (GAUZE/BANDAGES/DRESSINGS) ×6 IMPLANT
CUFF TOURN SGL QUICK 18 (TOURNIQUET CUFF) ×3 IMPLANT
DECANTER SPIKE VIAL GLASS SM (MISCELLANEOUS) ×3 IMPLANT
DRAIN PENROSE 18X1/4 LTX STRL (WOUND CARE) IMPLANT
DRAPE OEC MINIVIEW 54X84 (DRAPES) ×3 IMPLANT
DRAPE U-SHAPE 47X51 STRL (DRAPES) ×3 IMPLANT
DRSG ADAPTIC 3X8 NADH LF (GAUZE/BANDAGES/DRESSINGS) ×6 IMPLANT
DRSG PAD ABDOMINAL 8X10 ST (GAUZE/BANDAGES/DRESSINGS) ×3 IMPLANT
ELECT REM PT RETURN 9FT ADLT (ELECTROSURGICAL) ×3
ELECTRODE REM PT RTRN 9FT ADLT (ELECTROSURGICAL) ×2 IMPLANT
EVACUATOR 1/8 PVC DRAIN (DRAIN) ×3 IMPLANT
GAUZE SPONGE 4X4 12PLY STRL (GAUZE/BANDAGES/DRESSINGS) ×6 IMPLANT
GAUZE SPONGE 4X4 16PLY XRAY LF (GAUZE/BANDAGES/DRESSINGS) ×6 IMPLANT
GAUZE XEROFORM 5X9 LF (GAUZE/BANDAGES/DRESSINGS) ×3 IMPLANT
GLOVE BIO SURGEON STRL SZ8 (GLOVE) ×3 IMPLANT
GOWN STRL REUS W/TWL XL LVL3 (GOWN DISPOSABLE) ×3 IMPLANT
KIT BASIN OR (CUSTOM PROCEDURE TRAY) ×3 IMPLANT
KWIRE 4.0 X .045IN (WIRE) ×6 IMPLANT
KWIRE 4.0 X .062IN (WIRE) ×6 IMPLANT
LOOP VESSEL MAXI BLUE (MISCELLANEOUS) ×3 IMPLANT
MANIFOLD NEPTUNE II (INSTRUMENTS) ×3 IMPLANT
NS IRRIG 1000ML POUR BTL (IV SOLUTION) ×3 IMPLANT
PACK ORTHO EXTREMITY (CUSTOM PROCEDURE TRAY) ×3 IMPLANT
PAD CAST 3X4 CTTN HI CHSV (CAST SUPPLIES) ×2 IMPLANT
PAD CAST 4YDX4 CTTN HI CHSV (CAST SUPPLIES) ×2 IMPLANT
PADDING CAST COTTON 3X4 STRL (CAST SUPPLIES) ×1
PADDING CAST COTTON 4X4 STRL (CAST SUPPLIES) ×1
PEG LOCKING SMOOTH 2.2X16 (Screw) ×3 IMPLANT
PEG LOCKING SMOOTH 2.2X18 (Peg) ×12 IMPLANT
PLATE NARROW DVR RIGHT (Plate) ×3 IMPLANT
POSITIONER SURGICAL ARM (MISCELLANEOUS) ×3 IMPLANT
SCREW LOCK 14X2.7X 3 LD TPR (Screw) ×2 IMPLANT
SCREW LOCKING 2.7X13MM (Screw) ×9 IMPLANT
SCREW LOCKING 2.7X14 (Screw) ×1 IMPLANT
SCREW LOCKING 2.7X15MM (Screw) ×3 IMPLANT
SOL PREP POV-IOD 4OZ 10% (MISCELLANEOUS) ×3 IMPLANT
SOL PREP PROV IODINE SCRUB 4OZ (MISCELLANEOUS) ×3 IMPLANT
SPLINT FIBERGLASS 4X15 (CAST SUPPLIES) ×3 IMPLANT
SPLINT FINGER 4.25 BULB 911906 (SOFTGOODS) ×3 IMPLANT
SUT BONE WAX W31G (SUTURE) ×3 IMPLANT
SUT CHROMIC 5 0 P 3 (SUTURE) ×3 IMPLANT
SUT ETHILON 6 0 PS 3 18 (SUTURE) ×3 IMPLANT
SUT MERSILENE 4 0 P 3 (SUTURE) ×3 IMPLANT
SUT MNCRL AB 4-0 PS2 18 (SUTURE) ×3 IMPLANT
SUT PROLENE 3 0 PS 2 (SUTURE) ×3 IMPLANT
SUT PROLENE 4 0 P 3 18 (SUTURE) ×3 IMPLANT
SUT PROLENE 4 0 PS 2 18 (SUTURE) ×9 IMPLANT
SUT PROLENE 4 0 RB 1 (SUTURE) ×1
SUT PROLENE 4-0 RB1 .5 CRCL 36 (SUTURE) ×2 IMPLANT
SUT PROLENE 5 0 P 3 (SUTURE) ×6 IMPLANT
SUT VIC AB 0 CT1 27 (SUTURE) ×2
SUT VIC AB 0 CT1 27XBRD ANTBC (SUTURE) ×4 IMPLANT
SUT VIC AB 1 CT1 27 (SUTURE)
SUT VIC AB 1 CT1 27XBRD ANTBC (SUTURE) IMPLANT
SUT VIC AB 2-0 CT1 27 (SUTURE) ×1
SUT VIC AB 2-0 CT1 27XBRD (SUTURE) IMPLANT
SUT VIC AB 2-0 CT1 TAPERPNT 27 (SUTURE) ×2 IMPLANT
SUT VIC AB 3-0 SH 27 (SUTURE) ×1
SUT VIC AB 3-0 SH 27X BRD (SUTURE) ×2 IMPLANT
SWAB COLLECTION DEVICE MRSA (MISCELLANEOUS) IMPLANT
SWAB CULTURE ESWAB REG 1ML (MISCELLANEOUS) IMPLANT
SYR 20CC LL (SYRINGE) ×3 IMPLANT
SYR CONTROL 10ML LL (SYRINGE) IMPLANT
SYSTEM CHEST DRAIN TLS 7FR (DRAIN) ×3 IMPLANT
TOWEL OR 17X26 10 PK STRL BLUE (TOWEL DISPOSABLE) ×3 IMPLANT
UNDERPAD 30X30 INCONTINENT (UNDERPADS AND DIAPERS) ×3 IMPLANT
WATER STERILE IRR 1500ML POUR (IV SOLUTION) ×3 IMPLANT

## 2015-07-21 NOTE — Anesthesia Preprocedure Evaluation (Addendum)
Anesthesia Evaluation  Patient identified by MRN, date of birth, ID band Patient awake    Reviewed: Allergy & Precautions, NPO status , Patient's Chart, lab work & pertinent test results  History of Anesthesia Complications Negative for: history of anesthetic complications  Airway Mallampati: III  TM Distance: >3 FB Neck ROM: Full    Dental  (+) Teeth Intact, Dental Advisory Given, Chipped   Pulmonary asthma , Current Smoker,  Left chest tube  Inspiratory and expiratory wheezing bilaterally   + decreased breath sounds+ wheezing      Cardiovascular Exercise Tolerance: Good (-) hypertensionnegative cardio ROS Normal cardiovascular exam Rhythm:Regular Rate:Normal     Neuro/Psych PSYCHIATRIC DISORDERS Anxiety negative neurological ROS     GI/Hepatic negative GI ROS, Neg liver ROS,   Endo/Other  negative endocrine ROS  Renal/GU negative Renal ROS     Musculoskeletal negative musculoskeletal ROS (+)   Abdominal   Peds  Hematology negative hematology ROS (+)   Anesthesia Other Findings Day of surgery medications reviewed with the patient.  Reproductive/Obstetrics negative OB ROS                             Anesthesia Physical Anesthesia Plan  ASA: II  Anesthesia Plan: General   Post-op Pain Management:    Induction: Intravenous  Airway Management Planned: LMA  Additional Equipment:   Intra-op Plan:   Post-operative Plan: Extubation in OR  Informed Consent: I have reviewed the patients History and Physical, chart, labs and discussed the procedure including the risks, benefits and alternatives for the proposed anesthesia with the patient or authorized representative who has indicated his/her understanding and acceptance.   Dental advisory given  Plan Discussed with: CRNA  Anesthesia Plan Comments: (Risks/benefits of general anesthesia discussed with patient including risk of  damage to teeth, lips, gum, and tongue, nausea/vomiting, allergic reactions to medications, and the possibility of heart attack, stroke and death.  All patient questions answered.  Patient wishes to proceed.)        Anesthesia Quick Evaluation
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
## 2015-07-21 NOTE — Op Note (Signed)
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
## 2015-07-21 NOTE — Anesthesia Procedure Notes (Addendum)
Anesthesia Regional Block:  Interscalene brachial plexus block  Pre-Anesthetic Checklist: ,, timeout performed, Correct Patient, Correct Site, Correct Laterality, Correct Procedure, Correct Position, site marked, Risks and benefits discussed,  Surgical consent,  Pre-op evaluation,  At surgeon's request and post-op pain management  Laterality: Right  Prep: chloraprep       Needles:  Injection technique: Single-shot  Needle Type: Echogenic Stimulator Needle     Needle Length: 9cm 9 cm Needle Gauge: 22 and 22 G    Additional Needles:  Procedures: ultrasound guided (picture in chart) Interscalene brachial plexus block Narrative:  Injection made incrementally with aspirations every 5 mL.  Performed by: Personally  Anesthesiologist: Cecile HearingURK, STEPHEN EDWARD  Additional Notes: Functioning IV was confirmed and monitors were applied.  A 90mm 22ga Arrow echogenic stimulator needle was used. Sterile prep, hand hygiene and sterile gloves were used.  Negative aspiration and negative test dose prior to incremental administration of local anesthetic. The patient tolerated the procedure well.  Ultrasound guidance: relevent anatomy identified, needle position confirmed, local anesthetic spread visualized around nerve(s), vascular puncture avoided.  Image printed for medical record.    Procedure Name: LMA Insertion Performed by: Kizzie FantasiaARVER, Shalon Salado J Pre-anesthesia Checklist: Patient identified, Emergency Drugs available, Suction available, Patient being monitored and Timeout performed Patient Re-evaluated:Patient Re-evaluated prior to inductionOxygen Delivery Method: Circle system utilized Preoxygenation: Pre-oxygenation with 100% oxygen Intubation Type: IV induction Ventilation: Mask ventilation without difficulty LMA: LMA inserted LMA Size: 3.0 Number of attempts: 1 Placement Confirmation: positive ETCO2,  CO2 detector and breath sounds checked- equal and bilateral Tube secured with:  Tape

## 2015-07-21 NOTE — H&P (Signed)
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 with ORIF of right wrist fracture.  Albuterol PRN  Shawndell Varas 07/21/2015, 9:15 AM   Procedures

## 2015-07-21 NOTE — Transfer of Care (Signed)
Immediate Anesthesia Transfer of Care Note  Patient: Rebekah Roman  Procedure(s) Performed: Procedure(s): OPEN REDUCTION INTERNAL FIXATION (ORIF) RADIAL FRACTURE (Right) IRRIGATION AND DEBRIDEMENT RIGHT HAND, PARTIAL AMPUTATION OF RIGHT MIDDLE FINGER (Right)  Patient Location: PACU  Anesthesia Type:General  Level of Consciousness: sedated, patient cooperative and responds to stimulation  Airway & Oxygen Therapy: Patient Spontanous Breathing and Patient connected to face mask oxygen  Post-op Assessment: Report given to RN and Post -op Vital signs reviewed and stable  Post vital signs: Reviewed and stable  Last Vitals:  Filed Vitals:   07/21/15 0859 07/21/15 1541  BP: 131/86 121/72  Pulse: 120 101  Temp: 37.1 C 36.7 C  Resp: 21 20    Complications: No apparent anesthesia complications

## 2015-07-21 NOTE — ED Notes (Signed)
Patient transported to CT 
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
## 2015-07-21 NOTE — Op Note (Signed)
Left tube thoracostomy  Indications:  Clinically significant Pneumothorax on left  Pre-operative Diagnosis: Pneumothorax, left side  Post-operative Diagnosis: same  Procedure Details  Informed consent was obtained for the procedure, including sedation.  Risks of lung perforation, hemorrhage, arrhythmia, and adverse drug reaction were discussed. Lidocaine, versed, and fentanyl were used for anesthesia.    After sterile skin prep, using standard technique, a 20 French tube was placed in the left lateral 5th rib space.  Findings: Large rush of air  Estimated Blood Loss:  Minimal         Specimens:  None              Complications:  None; patient tolerated the procedure well.         Disposition: stable in ED bed         Condition: stable  Attending Attestation: I performed the procedure.

## 2015-07-21 NOTE — Progress Notes (Signed)
PT Cancellation Note  Patient Details Name: Rebekah Roman MRN: [id number] DOB: [birth year]   Cancelled Treatment:    Reason Eval/Treat Not Completed: Other (comment) Spoke with RN. Patient is resting comfortably after a difficult morning managing pain. Likely to surgery today. Will follow up for PT evaluation, likely tomorrow.  [place], Orian Figueira S 07/21/2015, 1:28 PM  Sunday SpillersLogan Secor Arnold [place], [place] [phone]
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
## 2015-07-21 NOTE — Progress Notes (Signed)
[facility]. Right hand warm, with brisk cap refill. Chest Tube to left chest intact. VSS. Respirations even and regular. Report called to Marylin CrosbyJosephine Carson RN on 6N.
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
## 2015-07-21 NOTE — Progress Notes (Signed)
Orthopedic Tech Progress Note Patient Details:  Rebekah Roman 12-25-1990 [id number] Assisted Dr. Amanda PeaGramig with application of fiberglass short arm splint.  Unable to assess neurovascular function due to arm and hand completely bandaged prior to application of splint. Ortho Devices Type of Ortho Device: Short arm splint Ortho Device/Splint Location: RUE Ortho Device/Splint Interventions: Application   [name], [name] 07/21/2015, 6:50 AM

## 2015-07-22 ENCOUNTER — Inpatient Hospital Stay (HOSPITAL_COMMUNITY): Payer: BLUE CROSS/BLUE SHIELD

## 2015-07-22 ENCOUNTER — Encounter (HOSPITAL_COMMUNITY): Payer: Self-pay | Admitting: General Practice

## 2015-07-22 DIAGNOSIS — S2242XA Multiple fractures of ribs, left side, initial encounter for closed fracture: Secondary | ICD-10-CM | POA: Diagnosis present

## 2015-07-22 DIAGNOSIS — S62101A Fracture of unspecified carpal bone, right wrist, initial encounter for closed fracture: Secondary | ICD-10-CM | POA: Diagnosis present

## 2015-07-22 DIAGNOSIS — S060XAA Concussion with loss of consciousness status unknown, initial encounter: Secondary | ICD-10-CM | POA: Diagnosis present

## 2015-07-22 DIAGNOSIS — D62 Acute posthemorrhagic anemia: Secondary | ICD-10-CM | POA: Diagnosis not present

## 2015-07-22 DIAGNOSIS — S2220XA Unspecified fracture of sternum, initial encounter for closed fracture: Secondary | ICD-10-CM | POA: Diagnosis present

## 2015-07-22 DIAGNOSIS — S060X9A Concussion with loss of consciousness of unspecified duration, initial encounter: Secondary | ICD-10-CM | POA: Diagnosis present

## 2015-07-22 LAB — CBC
HCT: 35.7 % — ABNORMAL LOW (ref 36.0–46.0)
HEMOGLOBIN: 11.4 g/dL — AB (ref 12.0–15.0)
MCH: 30.6 pg (ref 26.0–34.0)
MCHC: 31.9 g/dL (ref 30.0–36.0)
MCV: 96 fL (ref 78.0–100.0)
Platelets: 208 10*3/uL (ref 150–400)
RBC: 3.72 MIL/uL — AB (ref 3.87–5.11)
RDW: 13.1 % (ref 11.5–15.5)
WBC: 8.7 10*3/uL (ref 4.0–10.5)

## 2015-07-22 LAB — BASIC METABOLIC PANEL
Anion gap: 7 (ref 5–15)
CALCIUM: 8.2 mg/dL — AB (ref 8.9–10.3)
CHLORIDE: 105 mmol/L (ref 101–111)
CO2: 23 mmol/L (ref 22–32)
CREATININE: 0.78 mg/dL (ref 0.44–1.00)
GFR calc non Af Amer: >60 mL/min (ref >60)
Glucose, Bld: 76 mg/dL (ref 65–99)
POTASSIUM: 4.2 mmol/L (ref 3.5–5.1)
SODIUM: 135 mmol/L (ref 135–145)

## 2015-07-22 MED ORDER — INFLUENZA VAC SPLIT QUAD 0.5 ML IM SUSY
0.5000 mL | PREFILLED_SYRINGE | INTRAMUSCULAR | Status: AC
  Administered 2015-07-24: 0.5 mL via INTRAMUSCULAR

## 2015-07-22 MED ORDER — PNEUMOCOCCAL VAC POLYVALENT 25 MCG/0.5ML IJ INJ
0.5000 mL | INJECTION | INTRAMUSCULAR | Status: AC
  Administered 2015-07-24: 0.5 mL via INTRAMUSCULAR

## 2015-07-22 MED ORDER — CHLORHEXIDINE GLUCONATE CLOTH 2 % EX PADS
6.0000 | MEDICATED_PAD | Freq: Every day | CUTANEOUS | Status: DC
  Administered 2015-07-22 – 2015-07-25 (×4): 6 via TOPICAL

## 2015-07-22 MED ORDER — IPRATROPIUM-ALBUTEROL 0.5-2.5 (3) MG/3ML IN SOLN
3.0000 mL | Freq: Four times a day (QID) | RESPIRATORY_TRACT | Status: DC
  Administered 2015-07-22 – 2015-07-25 (×14): 3 mL via RESPIRATORY_TRACT
  Filled 2015-07-22 (×13): qty 3

## 2015-07-22 MED ORDER — TRAMADOL HCL 50 MG PO TABS
100.0000 mg | ORAL_TABLET | Freq: Four times a day (QID) | ORAL | Status: DC
  Administered 2015-07-22 – 2015-07-25 (×13): 100 mg via ORAL
  Filled 2015-07-22 (×13): qty 2
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
## 2015-07-22 NOTE — Anesthesia Postprocedure Evaluation (Signed)
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)  Patient location during evaluation: PACU Anesthesia Type: General Level of consciousness: awake and alert Pain management: pain level controlled Vital Signs Assessment: post-procedure vital signs reviewed and stable Respiratory status: spontaneous breathing, nonlabored ventilation, respiratory function stable and patient connected to nasal cannula oxygen Cardiovascular status: blood pressure returned to baseline and stable Postop Assessment: no signs of nausea or vomiting Anesthetic complications: no    Last Vitals:  Filed Vitals:   07/22/15 0250 07/22/15 0640  BP: 99/59 113/62  Pulse: 110 119  Temp: 37.2 C 37.2 C  Resp: 16 16    Last Pain:  Filed Vitals:   07/22/15 0724  PainSc: 4                  Finis Hendricksen J

## 2015-07-22 NOTE — Progress Notes (Signed)
Patient ID: Jinny Sandersmily Poer, female   DOB: 1991-05-04, 24 y.o.   MRN: [id number] Patient has been seen and examined. Patient has pain appropriate to his injury/process. Patient denies new complaints at this present time. I have discussed the care pathway with nursing staff. Patient is appropriate and alert.  We reviewed vital signs and intake output which are stable.  The right upper extremity is neurovascularly intact. Refill is normal. There is no signs of compartment syndrome. There is no signs of dystrophy. There is normal sensation. Discussed the middle finger amputation and her predicament and plans going forward.  I have spent a  great deal of time discussing range of motion edema control and other techniques to decrease edema and promote flexion extension of the fingers. Patient understands the importance of elevation range of motion massage and other measures to lessen pain and prevent swelling.  We have also discussed immobilization to appropriate areas involved.  We have discussed with the patient shoulder range of motion to prevent adhesive capsulitis.    Drain was removed without difficulty  Patient will begin dressing changes to the elbow tomorrow  Continue ABX and post op Valla Leavermgt  Magally Vahle MD

## 2015-07-23 ENCOUNTER — Inpatient Hospital Stay (HOSPITAL_COMMUNITY): Payer: BLUE CROSS/BLUE SHIELD

## 2015-07-23 DIAGNOSIS — J189 Pneumonia, unspecified organism: Secondary | ICD-10-CM | POA: Diagnosis not present

## 2015-07-23 LAB — CBC
HCT: 40.3 % (ref 36.0–46.0)
HEMOGLOBIN: 12.7 g/dL (ref 12.0–15.0)
MCH: 29.8 pg (ref 26.0–34.0)
MCHC: 31.5 g/dL (ref 30.0–36.0)
MCV: 94.6 fL (ref 78.0–100.0)
Platelets: 219 10*3/uL (ref 150–400)
RBC: 4.26 MIL/uL (ref 3.87–5.11)
RDW: 13.1 % (ref 11.5–15.5)
WBC: 9.7 10*3/uL (ref 4.0–10.5)

## 2015-07-23 LAB — EXPECTORATED SPUTUM ASSESSMENT W GRAM STAIN, RFLX TO RESP C

## 2015-07-23 LAB — EXPECTORATED SPUTUM ASSESSMENT W REFEX TO RESP CULTURE

## 2015-07-23 MED ORDER — LEVOFLOXACIN 500 MG PO TABS
500.0000 mg | ORAL_TABLET | Freq: Every day | ORAL | Status: DC
  Administered 2015-07-23 – 2015-07-25 (×3): 500 mg via ORAL
  Filled 2015-07-23 (×3): qty 1

## 2015-07-23 MED ORDER — OXYCODONE HCL 5 MG PO TABS
10.0000 mg | ORAL_TABLET | ORAL | Status: DC | PRN
  Administered 2015-07-23 – 2015-07-25 (×13): 20 mg via ORAL
  Filled 2015-07-23 (×13): qty 4
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
## 2015-07-23 NOTE — Progress Notes (Signed)
Patient ID: Rebekah Roman, female   DOB: 1990-10-13, 24 y.o.   MRN: [id number] Patient seen at bedside.  Patient is alert and oriented.  Vital signs are stable.  She has no signs of infection distally or vascular compromise.  I discussed and showed her mother had to address the elbow with a wet-to-dry dressing change.  I provided instruction and note that the wound looks quite well.  There are 2 areas which will heal with secondary intention.  Patient and her mother demonstrated competency in the dressing changes which will institute daily.  We'll continue splint immobilization.  I discussed with the patient my findings and recommendations.  [place] [facility].  I discussed with the patient and her mother that certainly the elbow should heal uneventfully with proper dressing care.  The radius similarly should heal uneventfully as the ORIF when very smoothly- restoring her geometry about the radius.  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  I feel the  finger will be somewhat bulbous and it remains to be seen if she is going to be pleased with this.  Nevertheless we tried to save as much length as possible.  Certainly a revision amputation can be done at a time in the future if she isn't happy with the stability about the distal end.  I would recommend she be discharged home on Keflex 500 mg 4 times a day 10 days.  I would recommend the wet-to-dry dressing changes.  I feel that she needs follow-up in a week for wound check.  The family understands this.  They have my cell phone number and my business card.  Were happy to see them in [place] if  they cannot find orthopedics at home to take care of her care  Eleno Weimar MD
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
## 2015-07-24 ENCOUNTER — Inpatient Hospital Stay (HOSPITAL_COMMUNITY): Payer: BLUE CROSS/BLUE SHIELD

## 2015-07-24 MED ORDER — POLYETHYLENE GLYCOL 3350 17 G PO PACK
17.0000 g | PACK | Freq: Two times a day (BID) | ORAL | Status: DC
  Administered 2015-07-24: 17 g via ORAL
  Filled 2015-07-24: qty 1

## 2015-07-24 MED ORDER — POLYETHYLENE GLYCOL 3350 17 G PO PACK: 17.0000 g | PACK | Freq: Two times a day (BID) | ORAL | Status: DC

## 2015-07-24 NOTE — Progress Notes (Signed)
Orthopedic Tech Progress Note Patient Details:  Rebekah Roman [birth year] [id number]  Ortho Devices Type of Ortho Device: Arm sling Ortho Device/Splint Location: RUE Ortho Device/Splint Interventions: Application   Saul FordyceJennifer C [name] 07/24/2015, 4:46 PM
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
## 2015-07-25 ENCOUNTER — Inpatient Hospital Stay (HOSPITAL_COMMUNITY): Payer: BLUE CROSS/BLUE SHIELD

## 2015-07-25 LAB — BASIC METABOLIC PANEL
ANION GAP: 10 (ref 5–15)
BUN: 6 mg/dL (ref 6–20)
CALCIUM: 8.7 mg/dL — AB (ref 8.9–10.3)
CO2: 27 mmol/L (ref 22–32)
Chloride: 94 mmol/L — ABNORMAL LOW (ref 101–111)
Creatinine, Ser: 0.84 mg/dL (ref 0.44–1.00)
GFR calc Af Amer: >60 mL/min (ref >60)
GLUCOSE: 91 mg/dL (ref 65–99)
Potassium: 4 mmol/L (ref 3.5–5.1)
Sodium: 131 mmol/L — ABNORMAL LOW (ref 135–145)

## 2015-07-25 LAB — CULTURE, RESPIRATORY

## 2015-07-25 LAB — CULTURE, RESPIRATORY W GRAM STAIN

## 2015-07-25 MED ORDER — OXYCODONE-ACETAMINOPHEN 10-325 MG PO TABS: 1.0000 | ORAL_TABLET | ORAL | Status: AC | PRN

## 2015-07-25 MED ORDER — CEPHALEXIN 500 MG PO CAPS: 500.0000 mg | ORAL_CAPSULE | Freq: Four times a day (QID) | ORAL | Status: AC

## 2015-07-25 MED ORDER — LEVOFLOXACIN 500 MG PO TABS: 500.0000 mg | ORAL_TABLET | Freq: Every day | ORAL | Status: AC

## 2015-07-25 MED ORDER — METHOCARBAMOL 500 MG PO TABS: 500.0000 mg | ORAL_TABLET | Freq: Four times a day (QID) | ORAL | Status: AC | PRN

## 2015-07-25 MED ORDER — ACETAMINOPHEN 325 MG PO TABS
650.0000 mg | ORAL_TABLET | Freq: Four times a day (QID) | ORAL | Status: DC | PRN
  Administered 2015-07-25: 650 mg via ORAL
  Filled 2015-07-25: qty 2

## 2015-07-25 MED ORDER — TRAMADOL HCL 50 MG PO TABS: 100.0000 mg | ORAL_TABLET | Freq: Four times a day (QID) | ORAL | Status: AC

## 2015-07-25 NOTE — Progress Notes (Signed)
Received call from bedside RN that patient is awaiting Nebulizer Machine to be delivered to room so that patient can be discharged today. CM called into room and verified that machine had not been delivered. Pt is awaiting parents to come from [facility] [place] they have been staying as they reside in [place]. Called and left message for Trey PaulaJeff, who delivers [facility],  that we are awaiting delivery of nebulizer.
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
## 2015-07-25 NOTE — Discharge Instructions (Signed)
Wash chest wound daily in shower with soap and water. Do not soak. Apply antibiotic ointment (e.g. Neosporin) twice daily and as needed to keep moist. Cover with dry dressing.  Keep right arm splint on and dry.  No driving while taking oxycodone.

## 2015-07-25 NOTE — Progress Notes (Signed)
Patient ID: Rebekah Roman, female   DOB: January 24, 1991, 25 y.o.   MRN: [id number] Patient is doing well  She will be discharged from day.  She is stable understands dressing changes.  I discussed with her follow-up care plans and all other issues. We will look forward to seeing her in 10-14 days or [facility] [place] her follow-up management. These notes a been discussed.  Final diagnosis: ORIF right radius, middle finger revision amputation right upper extremity, Ion the posterior elbow wound extensive.  Garnet Chatmon M.D.

## 2015-07-25 NOTE — Care Management Note (Signed)
Case Management Note  Patient Details  Name: Jinny Sandersmily Esteve MRN: [id number] Date of Birth: 11-Jun-1991  Subjective/Objective:                    Action/Plan: Anticipate discharge home today. Awaiting delivery [facility] Nebulizer [facility]   Expected Discharge Date:                  Expected Discharge Plan:  Home/Self Care  In-House Referral:     Discharge planning Services  CM Consult  Post Acute Care Choice:  Durable Medical Equipment Choice offered to:     DME Arranged:  Nebulizer machine DME Agency:  [facility]  HH Arranged:  NA HH Agency:     Status of Service:  Completed, signed off  Medicare Important Message Given:    Date Medicare IM Given:    Medicare IM give by:    Date Additional Medicare IM Given:    Additional Medicare Important Message give by:     If discussed at Long Length of Stay Meetings, dates discussed:    Additional Comments:  Yvone NeuCrutchfield, Makaley Storts M, RN 07/25/2015, 8:55 AM

## 2015-07-25 NOTE — Progress Notes (Signed)
Discharge instructions gone over with patient and parents. Prescriptions given, others were sent electronically to [place].  Home medications gone over. Follow up appointment to be made. CD of xrays and ct scans given to patient. Left lateral chest dressing was changed. Diet, activity, and reasons to call the doctor were discussed. Signs and symptoms of infection discussed and emphasis was placed on completing oral antibiotics. Patient's mother had been instructed by physician on how to do right arm dressing change. Patient was given oral pain medication and ativan prior to discharge for the ride to [place]. She verbalized understanding of instructions and understood she will have to contact her primary care doctor for refills on her routine daily medications. There were no further questions and patient was discharged.
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
